# Patient Record
Sex: Male | Born: 1995 | Hispanic: Yes | Marital: Single | Smoking: Never smoker
Health system: Southern US, Community
[De-identification: ages and names within clinical notes are randomized; demographics above are authoritative.]

## PROBLEM LIST (undated history)

## (undated) DIAGNOSIS — F329 Major depressive disorder, single episode, unspecified: Secondary | ICD-10-CM

## (undated) DIAGNOSIS — F32A Depression, unspecified: Secondary | ICD-10-CM

## (undated) DIAGNOSIS — F419 Anxiety disorder, unspecified: Secondary | ICD-10-CM

## (undated) DIAGNOSIS — F410 Panic disorder [episodic paroxysmal anxiety] without agoraphobia: Secondary | ICD-10-CM

## (undated) DIAGNOSIS — H409 Unspecified glaucoma: Secondary | ICD-10-CM

## (undated) HISTORY — PX: FRACTURE SURGERY: SHX138

---
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
## 2019-11-08 ENCOUNTER — Other Ambulatory Visit: Payer: Self-pay

## 2019-11-08 DIAGNOSIS — Z20822 Contact with and (suspected) exposure to covid-19: Secondary | ICD-10-CM

## 2019-11-10 LAB — NOVEL CORONAVIRUS, NAA: SARS-CoV-2, NAA: NOT DETECTED

## 2019-11-11 ENCOUNTER — Telehealth: Payer: Self-pay | Admitting: *Deleted

## 2019-11-11 NOTE — Telephone Encounter (Signed)
Patient called and given negative covid results . 

## 2019-11-13 ENCOUNTER — Encounter (HOSPITAL_COMMUNITY): Payer: Self-pay | Admitting: Emergency Medicine

## 2019-11-13 ENCOUNTER — Emergency Department (HOSPITAL_COMMUNITY)
Admission: EM | Admit: 2019-11-13 | Discharge: 2019-11-13 | Disposition: A | Discharge status: Home / Self Care | Payer: No Typology Code available for payment source | Attending: Emergency Medicine | Admitting: Emergency Medicine

## 2019-11-13 ENCOUNTER — Emergency Department (HOSPITAL_COMMUNITY): Payer: No Typology Code available for payment source

## 2019-11-13 ENCOUNTER — Other Ambulatory Visit: Payer: Self-pay

## 2019-11-13 DIAGNOSIS — Z79899 Other long term (current) drug therapy: Secondary | ICD-10-CM | POA: Diagnosis not present

## 2019-11-13 DIAGNOSIS — W540XXA Bitten by dog, initial encounter: Secondary | ICD-10-CM | POA: Diagnosis not present

## 2019-11-13 DIAGNOSIS — S0181XA Laceration without foreign body of other part of head, initial encounter: Secondary | ICD-10-CM

## 2019-11-13 DIAGNOSIS — Y929 Unspecified place or not applicable: Secondary | ICD-10-CM | POA: Insufficient documentation

## 2019-11-13 DIAGNOSIS — Y999 Unspecified external cause status: Secondary | ICD-10-CM | POA: Insufficient documentation

## 2019-11-13 DIAGNOSIS — Z23 Encounter for immunization: Secondary | ICD-10-CM | POA: Insufficient documentation

## 2019-11-13 DIAGNOSIS — Y939 Activity, unspecified: Secondary | ICD-10-CM | POA: Insufficient documentation

## 2019-11-13 HISTORY — DX: Depression, unspecified: F32.A

## 2019-11-13 HISTORY — DX: Anxiety disorder, unspecified: F41.9

## 2019-11-13 HISTORY — DX: Panic disorder (episodic paroxysmal anxiety): F41.0

## 2019-11-13 MED ORDER — HYDROCODONE-ACETAMINOPHEN 5-325 MG PO TABS: 1.0000 | ORAL_TABLET | Freq: Four times a day (QID) | ORAL | 0 refills | Status: AC | PRN

## 2019-11-13 MED ORDER — CLINDAMYCIN HCL 300 MG PO CAPS: 300.0000 mg | ORAL_CAPSULE | Freq: Three times a day (TID) | ORAL | 0 refills | Status: AC

## 2019-11-13 MED ORDER — TETANUS-DIPHTH-ACELL PERTUSSIS 5-2.5-18.5 LF-MCG/0.5 IM SUSP
0.5000 mL | Freq: Once | INTRAMUSCULAR | Status: AC
  Administered 2019-11-13: 0.5 mL via INTRAMUSCULAR
  Filled 2019-11-13: qty 0.5

## 2019-11-13 MED ORDER — MORPHINE SULFATE (PF) 4 MG/ML IV SOLN
4.0000 mg | Freq: Once | INTRAVENOUS | Status: AC
  Administered 2019-11-13: 4 mg via INTRAVENOUS
  Filled 2019-11-13: qty 1

## 2019-11-13 MED ORDER — IBUPROFEN 600 MG PO TABS: 600.0000 mg | ORAL_TABLET | Freq: Four times a day (QID) | ORAL | 0 refills | Status: AC | PRN

## 2019-11-13 MED ORDER — LIDOCAINE-EPINEPHRINE 1 %-1:100000 IJ SOLN
20.0000 mL | Freq: Once | INTRAMUSCULAR | Status: AC
  Administered 2019-11-13: 20 mL via INTRADERMAL
  Filled 2019-11-13: qty 20

## 2019-11-13 MED ORDER — LORAZEPAM 2 MG/ML IJ SOLN
1.0000 mg | Freq: Once | INTRAMUSCULAR | Status: AC
  Administered 2019-11-13: 1 mg via INTRAVENOUS
  Filled 2019-11-13: qty 1

## 2019-11-13 MED ORDER — LORAZEPAM 2 MG/ML IJ SOLN
INTRAMUSCULAR | Status: AC
  Filled 2019-11-13: qty 1

## 2019-11-13 MED ORDER — SODIUM CHLORIDE 0.9 % IV SOLN
3.0000 g | Freq: Once | INTRAVENOUS | Status: AC
  Administered 2019-11-13: 3 g via INTRAVENOUS
  Filled 2019-11-13: qty 8

## 2019-11-13 NOTE — ED Provider Notes (Signed)
[facility] EMERGENCY DEPARTMENT Provider Note   CSN: [id number] Arrival date & time: 11/13/19  1339     History   Chief Complaint Chief Complaint  Patient presents with   Animal Bite    HPI Juan Vega is a 23 y.o. male.     HPI   23yo male with history of PTSD, anxiety, depression presents with concern for dog bite.  Patient has an adopted pit bull who is UTD on vaccines, reports something triggered the dog and he bit him in the face, arms bilaterally, hands.  Severe facial lacerations. No other injuries. Does report right sided forearm pain at site of bite. Not known if UTD on tetanus.    Past Medical History:  Diagnosis Date   Anxiety    Depression    Panic attacks     There are no active problems to display for this patient.   History reviewed. No pertinent surgical history.      Home Medications    Prior to Admission medications   Medication Sig Start Date End Date Taking? Authorizing Provider  buPROPion (WELLBUTRIN SR) 100 MG 12 hr tablet Take 100 mg by mouth daily.   Yes [provider]  emtricitabine-tenofovir (TRUVADA) 200-300 MG tablet Take 1 tablet by mouth daily.   Yes [provider]  clindamycin (CLEOCIN) 300 MG capsule Take 1 capsule (300 mg total) by mouth 3 (three) times daily for 7 days. 11/13/19 11/20/19  Serena Colonel, MD  HYDROcodone-acetaminophen (NORCO/VICODIN) 5-325 MG tablet Take 1 tablet by mouth every 6 (six) hours as needed. 11/13/19   Alvira Monday, MD  ibuprofen (ADVIL) 600 MG tablet Take 1 tablet (600 mg total) by mouth every 6 (six) hours as needed. 11/13/19   Charlynne Pander, MD    Family History No family history on file.  Social History Social History   Tobacco Use   Smoking status: Never Smoker   Smokeless tobacco: Never Used  Substance Use Topics   Alcohol use: Yes   Drug use: Yes    Types: Marijuana     Allergies   Patient has no known allergies.   Review of  Systems Review of Systems  Constitutional: Negative for fever.  Gastrointestinal: Negative for nausea and vomiting.  Musculoskeletal: Positive for arthralgias.  Skin: Positive for wound.  Neurological: Negative for numbness.     Physical Exam Updated Vital Signs BP 132/90 (BP Location: Left Arm)    Pulse 92    Temp 97.9 F (36.6 C) (Oral)    Resp 20    Wt 65.8 kg    SpO2 94%   Physical Exam Vitals signs and nursing note reviewed.  Constitutional:      General: He is not in acute distress.    Appearance: He is well-developed. He is not diaphoretic.  HENT:     Head: Normocephalic.     Comments: Lacerations (see photo) Between eyebrow, nasal ala, left upper lip with significant defect and missing tissue, superficial punctures to cheek Eyes:     Conjunctiva/sclera: Conjunctivae normal.  Neck:     Musculoskeletal: Normal range of motion.  Cardiovascular:     Rate and Rhythm: Normal rate and regular rhythm.  Pulmonary:     Effort: Pulmonary effort is normal. No respiratory distress.  Abdominal:     Tenderness: There is no guarding.  Musculoskeletal:     Right forearm: He exhibits tenderness and bony tenderness.     Left forearm: He exhibits no bony tenderness.  Right hand: He exhibits no bony tenderness.     Left hand: He exhibits no bony tenderness.     Comments: Small puncture wounds and superficial scratches bilateral forearms, hands  Skin:    General: Skin is warm and dry.  Neurological:     Mental Status: He is alert and oriented to person, place, and time.        ED Treatments / Results  Labs (all labs ordered are listed, but only abnormal results are displayed) Labs Reviewed - No data to display  EKG None  Radiology Dg Forearm Right  Result Date: 11/13/2019 CLINICAL DATA:  Dog bite, bit by his pit bull EXAM: RIGHT FOREARM - 2 VIEW COMPARISON:  None FINDINGS: Osseous mineralization normal. Wrist and elbow joint alignments normal. No acute fracture,  dislocation, or bone destruction. No radiopaque foreign bodies or soft tissue gas. IV at RIGHT antecubital fossa. IMPRESSION: No acute abnormalities. Electronically Signed   By: Ulyses SouthwardMark  Boles M.D.   On: 11/13/2019 15:27    Procedures Procedures (including critical care time)  Medications Ordered in ED Medications  Ampicillin-Sulbactam (UNASYN) 3 g in sodium chloride 0.9 % 100 mL IVPB (0 g Intravenous Stopped 11/13/19 1555)  Tdap (BOOSTRIX) injection 0.5 mL (0.5 mLs Intramuscular Given 11/13/19 1435)  morphine 4 MG/ML injection 4 mg (4 mg Intravenous Given 11/13/19 1435)  lidocaine-EPINEPHrine (XYLOCAINE W/EPI) 1 %-1:100000 (with pres) injection 20 mL (20 mLs Intradermal Given 11/13/19 1708)  LORazepam (ATIVAN) injection 1 mg (1 mg Intravenous Given 11/13/19 1553)     Initial Impression / Assessment and Plan / ED Course  I have reviewed the triage vital signs and the nursing notes.  Pertinent labs & imaging results that were available during my care of the patient were reviewed by me and considered in my medical decision making (see chart for details).        23yo male with history of PTSD, anxiety, depression presents with concern for dog bite. Dog UTD on rabies vaccines.  Pt TDaP updated.  XR without signs of fracture. Doubt other fracture. Foreamr, hand injuries superficial, recommend irrigation, general wound care.   Given unasyn. Consulted Dr. Pollyann Kennedyosen of ENT regarding significant facial lacerations, initial plan for ED irrigation and closure of nasal and laceration between eyebrows with plan for outpatient plastic surgery follow up for reconstructive repair given defect. Discussed with plastic surgery who is not on call and recommend initial management via ENT with later referral after acute injury treatment for possible reconstructive surgery/skin graft or repair given immediate infection risk. Again discussed with Dr. Pollyann Kennedyosen who came to bedside to evaluate the patient who initially was upset,  declining care.  In his upset state, did state that he would consider suicide if he had to live with significant facial deformity and scarring from the injury. He was agitated and initially declined repair by Dr. Pollyann Kennedyosen.  After further discussion, he agreed to repair and was given IV ativan to help with anxiety.  Dr. Pollyann Kennedyosen completed repair per his note of the 3 facial lacerations.  Pt received unasyn in ED, given rx for clindamycin with Dr. Pollyann Kennedyosen and will follow up with him as outpatient.  Recommend ibuprofen/tylenol primarily for pain. Reviewed in [place] drug database. Pt requesting narcotic medications for pain, discussed risks and discouraged use of this but ultimately pt was given 5 tablets of norco for breakthrough pain in addition to the clindamycin rx prescribed by Dr. Pollyann Kennedyosen.  He denies any ongoing suicidal ideation.  Statements made were in  setting of acute injury and associated anxiety regarding appearance and do not feel he requires emergent psychiatric evaluation this evening or inpatient treatment given his denial of continuing SI.   Patient discharged in stable condition with understanding of reasons to return.    Final Clinical Impressions(s) / ED Diagnoses   Final diagnoses:  Facial laceration, initial encounter  Dog bite, initial encounter    ED Discharge Orders         Ordered    clindamycin (CLEOCIN) 300 MG capsule  3 times daily     11/13/19 1624    ibuprofen (ADVIL) 600 MG tablet  Every 6 hours PRN     11/13/19 1702    HYDROcodone-acetaminophen (NORCO/VICODIN) 5-325 MG tablet  Every 6 hours PRN     11/13/19 1711           Gareth Morgan, MD 11/13/19 2317

## 2019-11-13 NOTE — ED Notes (Signed)
Patient transported to X-ray 

## 2019-11-13 NOTE — ED Triage Notes (Signed)
Pt in from home via [facility] with dog bite to face. Pt's own pitbull, up-to-date on vaccines. Pt states dog got triggered and attacked him, biting L lip corner, bilateral wrists and L of nose. Given 100 mcg Fentanyl en route.

## 2019-11-13 NOTE — Discharge Instructions (Addendum)
Apply antibiotic ointment such as neomycin or bacitracin.  Do this 2 or 3 times daily.  Also apply to all of the scrapes or any other small wounds.  Take the antibiotic that was prescribed.  Eat and drink what ever you like but be careful not to stretch the left side of your lip excessively.  You can expect some swelling and some crusting, that is normal.  Is okay to clean any scabs or crusting with peroxide on a Q-tip.  Do that prior to applying the antibiotic ointment.  Take Tylenol 1000 mg 3-4 times a day for 1 week. This is the maximum dose of Tylenol (acetaminophen) you can take from all sources. Please check other over-the-counter medications and prescriptions to ensure you are not taking other medications that contain acetaminophen.  You may also take ibuprofen 400 mg 6 times a day alternating with or at the same time as tylenol.

## 2019-11-13 NOTE — Consult Note (Addendum)
Reason for Consult: Dog bite to face Referring Physician: Gareth Morgan, MD  Juan Vega is an 23 y.o. male.  HPI: Suffered multiple injuries from a pitbull dog bite to the face earlier today.  He is here with his partner who is also a patient and he was waiting in the waiting room to have his finger evaluated from the same encounter.  Patient himself suffers with PTSD and anxiety.  Partner is concerned about his mental state.  Past Medical History:  Diagnosis Date  . Anxiety   . Depression   . Panic attacks     History reviewed. No pertinent surgical history.  No family history on file.  Social History:  reports that he has never smoked. He has never used smokeless tobacco. He reports current alcohol use. He reports current drug use. Drug: Marijuana.  Allergies: No Known Allergies  Medications: Reviewed  No results found for this or any previous visit (from the past 69 hour(s)).  No results found.  SHF:WYOVZCHY except as listed in admit H&P  Blood pressure (!) 145/90, pulse (!) 50, temperature 97.9 F (36.6 C), temperature source Oral, resp. rate 17, weight 65.8 kg, SpO2 98 %.  PHYSICAL EXAM: Overall appearance:  Healthy appearing, very anxious, angry at times, yelling and screaming. Head:  Normocephalic, atraumatic. Face: Multiple complicated lacerations to the face including the left medial eyebrow area, the left nasal ala area, and the left lower lip/cheek area.  Most complex is the left lower lip/cheek injury where there appears to be a significant section of skin missing. Ears: External ears look healthy. Nose: External nose reveals the above-mentioned injury to the ala. Oral Cavity/Pharynx:  There are no mucosal lesions or masses identified.  The oral mucosa appears to be intact. Larynx/Hypopharynx: Deferred Neuro:  No identifiable neurologic deficits. Neck: No palpable neck masses.  Studies Reviewed: none  Procedures: none   Assessment/Plan: Complex  facial lacerations from a recent dog bite injury.  I have recommended simple repair of the eyebrow and nasal ala injury.  I have also recommended attempting repair of what ever could be repairable involving the lip/cheek injury.  Ultimately he is going to require additional reconstructive work possibly with skin graft and possibly with tissue transfer.  The patient is combative and very angry.  He is asking for a second opinion.  There is no other physician available for that.  Plastic surgery was already called earlier and there is nothing they would do differently on an acute setting.  Patient himself is wondering what can be done to prevent any scarring.  He even mentioned at one point that he would be willing to commit suicide based on the injuries.  After multiple attempts to discuss this with the patient, he is still unwilling to allow me to proceed with any repair.  I am going to recommend urgent psychiatric care for him.  Izora Gala 11/13/2019, 3:12 PM    After several conversations with the emergency department staff, patient agreed to undergo repair after receiving intravenous sedation.  He was given intravenous Ativan and settled down nicely.  Procedure note:  Sterile drapes were applied around the facial area.  1% Xylocaine with epinephrine was used infiltrate all 3 wounds to provide local anesthetic.  He tolerated this reasonably well.  The wounds were cleaned with Betadine solution.  All repairs were performed using a combination of running and interrupted 4-0 Vicryl and 4-0 chromic suture.  The lip closure was accomplished by bringing the lip mucosa around to  the front and reapproximating to the skin.  This did result in adequate closure.  There is minimal tension.  Some of the skin edges had questionable viability.  The nasal alar lesion was reapproximated in a similar fashion.  There is also a small area of skin edge necrosis.  The left lateral brow also was closed in a similar  fashion.  There appeared to be some skin that was of questionable viability as well.  Bacitracin was applied to all the lesions.  No other lesions were identified.  Total length of the eyebrow laceration was approximately 2-1/2 cm.  Total length of the nasal ala was approximately 1.5 cm.  Total length of the lip/cheek laceration was approximately 6 cm total.

## 2019-11-13 NOTE — ED Notes (Signed)
I have spoke with Dr. Darl Householder and Dr. Billy Fischer, they only want pt to have tylenol and ibuprofen regimen for pain.

## 2020-12-18 IMAGING — CR DG FOREARM 2V*R*
2 series · 2 of 2 positions shown · non-contrast
Comparison: None

CLINICAL DATA: Dog bite, bit by his pit bull

EXAM:
RIGHT FOREARM - 2 VIEW

[forearm ap]
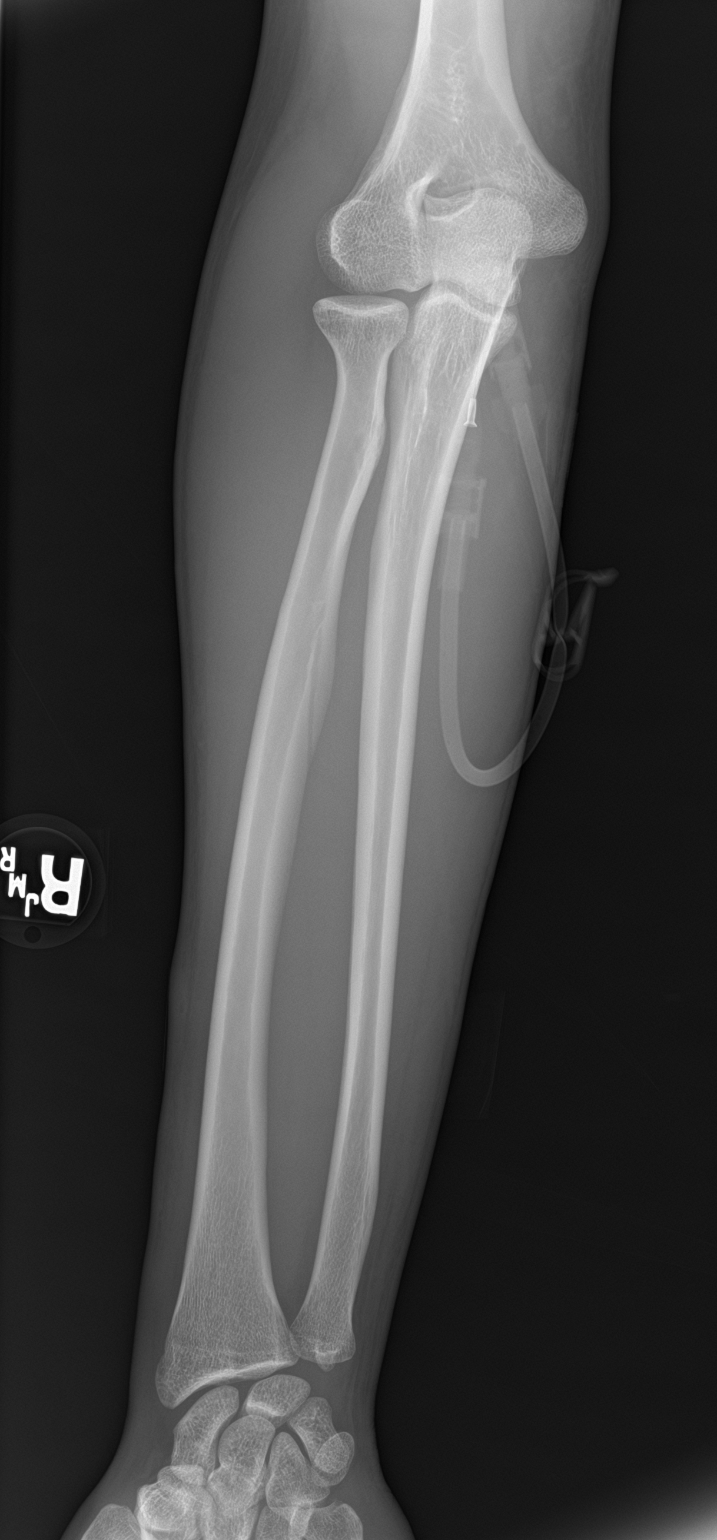

[forearm lat]
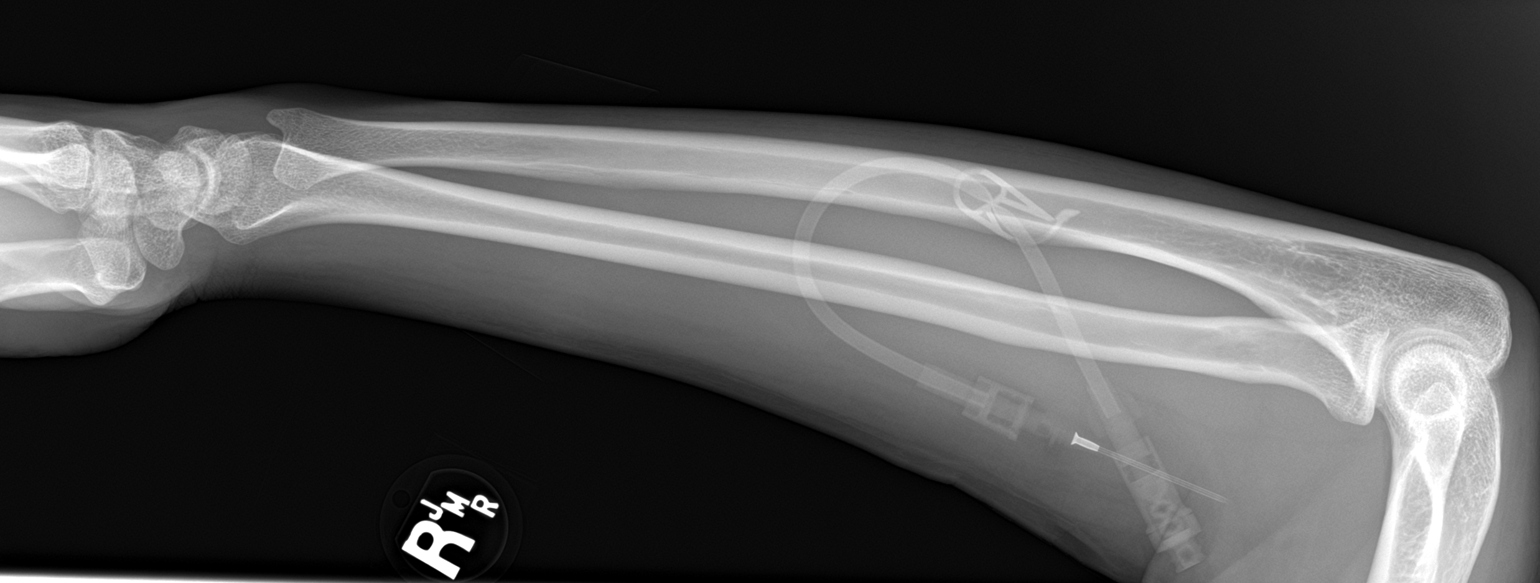

[2 of 2 positions shown; findings below may reference images not displayed]

FINDINGS: Osseous mineralization normal.

Wrist and elbow joint alignments normal.

No acute fracture, dislocation, or bone destruction.

No radiopaque foreign bodies or soft tissue gas.

IV at RIGHT antecubital fossa.
IMPRESSION: No acute abnormalities.

## 2021-08-02 ENCOUNTER — Emergency Department
Admission: EM | Admit: 2021-08-02 | Discharge: 2021-08-02 | Disposition: A | Discharge status: Home / Self Care | Payer: 59 | Attending: Emergency Medicine | Admitting: Emergency Medicine

## 2021-08-02 DIAGNOSIS — T148XXA Other injury of unspecified body region, initial encounter: Secondary | ICD-10-CM

## 2021-08-02 DIAGNOSIS — S0181XA Laceration without foreign body of other part of head, initial encounter: Secondary | ICD-10-CM | POA: Insufficient documentation

## 2021-08-02 DIAGNOSIS — S80211A Abrasion, right knee, initial encounter: Secondary | ICD-10-CM | POA: Insufficient documentation

## 2021-08-02 DIAGNOSIS — S70211A Abrasion, right hip, initial encounter: Secondary | ICD-10-CM | POA: Insufficient documentation

## 2021-08-02 DIAGNOSIS — Y9301 Activity, walking, marching and hiking: Secondary | ICD-10-CM | POA: Insufficient documentation

## 2021-08-02 DIAGNOSIS — F10920 Alcohol use, unspecified with intoxication, uncomplicated: Secondary | ICD-10-CM

## 2021-08-02 DIAGNOSIS — W109XXA Fall (on) (from) unspecified stairs and steps, initial encounter: Secondary | ICD-10-CM | POA: Insufficient documentation

## 2021-08-02 DIAGNOSIS — S60512A Abrasion of left hand, initial encounter: Secondary | ICD-10-CM | POA: Insufficient documentation

## 2021-08-02 DIAGNOSIS — F10120 Alcohol abuse with intoxication, uncomplicated: Secondary | ICD-10-CM | POA: Insufficient documentation

## 2021-08-02 DIAGNOSIS — S0990XA Unspecified injury of head, initial encounter: Secondary | ICD-10-CM | POA: Insufficient documentation

## 2021-08-02 MED ORDER — TETANUS-DIPHTH-ACELL PERTUSSIS 5-2.5-18.5 LF-MCG/0.5 IM SUSP
0.5000 mL | Freq: Once | INTRAMUSCULAR | Status: DC
Start: 2021-08-02 — End: 2021-08-02

## 2021-08-02 MED ORDER — LIDOCAINE HCL 1 % IJ SOLN
10.0000 mL | Freq: Once | INTRAMUSCULAR | Status: AC
Start: 2021-08-02 — End: 2021-08-02
  Administered 2021-08-02: 10 mL via INTRADERMAL
  Filled 2021-08-02: qty 10

## 2021-08-02 MED ORDER — BACITRACIN +/- ZINC 500 UNIT/GM EX OINT (WRAP)
TOPICAL_OINTMENT | Freq: Once | CUTANEOUS | Status: AC
Start: 2021-08-02 — End: 2021-08-02
  Administered 2021-08-02: 1 g via TOPICAL
  Filled 2021-08-02: qty 1

## 2021-08-02 NOTE — ED Triage Notes (Signed)
[name] is a 25 y.o. male with a cc of a head injury s/p fall while walking on steps. Pt reports fall was secondary to ETOH intoxication. Lac to forehead. C/o headache, b/l arm pain, and some abrasions to BLE. Tetanus not UTD. No meds PTA.   Blood pressure 123/77, pulse 97, temperature 97.3 F (36.3 C), resp. rate 16, weight 75.3 kg, SpO2 96 %.

## 2021-08-02 NOTE — ED Notes (Signed)
Pt ambulated well without assistance.

## 2021-08-03 LAB — GLUCOSE WHOLE BLOOD - POCT: Whole Blood Glucose POCT: 109 mg/dL — ABNORMAL HIGH (ref 70–100)

## 2021-08-03 NOTE — ED Provider Notes (Signed)
EMERGENCY DEPARTMENT HISTORY AND PHYSICAL EXAM        Date: 08/02/2021  Patient Name: [name]  Attending Physician: Dr Freda Jackson   Advanced Practice Provider: Shanda Bumps PA-C    History of Presenting Illness       History Provided By: Patient and patient's friend    Chief Complaint:  Chief Complaint   Patient presents with    Fall    Alcohol Intoxication    Head Injury       Additional History: [name] is a 25 y.o. male presenting to the ED accompanied by friend for eval of right forehead lac s/p fall PTA. Patient reports fall was 2/2 ETOH intox. Admits to drinking with friends and states was going down some stairs when he missed a step. Denies loc/syncope, nausea, vomiting, headache, back/neck pain/injury, gait disturbance, abd pain, nv, hematemesis, palpitations, cp, sob.      PCP: Pcp, None, MD  SPECIALISTS:    No current facility-administered medications for this encounter.     Current Outpatient Medications   Medication Sig Dispense Refill    guaiFENesin-codeine (ROBITUSSIN AC) 100-10 MG/5ML syrup Take 5 mLs by mouth 3 (three) times daily as needed for Cough.      hyoscyamine (ANASPAZ,LEVSIN) 0.125 MG tablet Take 0.125 mg by mouth every 4 (four) hours as needed for Cramping.         Past History     Past Medical History:  Past Medical History:   Diagnosis Date    Glaucoma        Past Surgical History:  Past Surgical History:   Procedure Laterality Date    FRACTURE SURGERY      left arm       Family History:  History reviewed. No pertinent family history.    Social History:  Social History     Socioeconomic History    Marital status: Single     Spouse name: None    Number of children: None    Years of education: None    Highest education level: None   Occupational History    None   Tobacco Use    Smoking status: Never    Smokeless tobacco: Never   Vaping Use    Vaping Use: Never used   Substance and Sexual Activity    Alcohol use: Yes    Drug use: No    Sexual activity: None   Other  Topics Concern    None   Social History Narrative    None     Social Determinants of Health     Financial Resource Strain: Not on file   Food Insecurity: Not on file   Transportation Needs: Not on file   Physical Activity: Not on file   Stress: Not on file   Social Connections: Not on file   Intimate Partner Violence: Not on file   Housing Stability: Not on file       Allergies:  No Known Allergies    Review of Systems     Review of Systems   Skin:  Positive for wound.     A pertinent 10 point review of systems was performed and was normal except as otherwise noted. Please also see HPI for added review of systems    Physical Exam     Vitals:    08/02/21 0324 08/02/21 0324   BP:  123/77   Pulse:  97   Resp:  16   Temp:  97.3 F (36.3 C)  SpO2:  96%   Weight: 75.3 kg        Physical Exam    CONSTITUTIONAL: Well-developed, well-nourished, well groomed male in above the knee jean shorts and short sleeve button shirt. Alert, cooperative. Friend at bedside.  HEAD: Normocephalic.  HENT: Atraumatic to external nose and ears, oral mucosa moist. Normal nares without rhinorrhea.  EYES: Conjunctiva clear. No icterus. No discharge or tearing.   NECK: Supple, non tender, trachea midline  RESPIRATORY: Normal work of breathing, no respiratory distress.  CARDIAC: Regular rate. 2+ distal pulses bilaterally  GI: soft, nttp  MS: No bony tenderness. Full ROM grossly intact. No CT  upper and lower extremities Active FROM, no edema, no visible deformity, 5+ MS, NVID distally, 2+ radial pulses bilaterally. Remainder extremities FROM and 5/5 MS  Back: Normal inspection of back, no step offs   Cervical back:  no bony tenderness.       Thoracic back:  no bony tenderness.     Lumbar back:  no bony tenderness.    Hip flexion, adduction, abduction intact   +Full Range of motion of back   5+ muscle strength of upper and lower extremities.   SKIN: Warm and dry.  No ecchymosis.   Right lateral forehead 2cm laceration  Right flank/hip 3cm x 4cm  superficial abrasion  Right knee 2cm x 2cm superficial abrasion  Left hand 2cm x 2cm superficial abrasion  NEURO: AOx3. Normal gait  PSYCH: appropriate mood and affect. alert and oriented to person, place and time    Diagnostic Study Results     Labs -     Results       ** No results found for the last 24 hours. **            Radiologic Studies -   Radiology Results (24 Hour)       ** No results found for the last 24 hours. **        .    Medical Decision Making     I reviewed the vital signs, available nursing notes, past medical history, past surgical history, family history and social history.    Vital Signs-Reviewed the patient's vital signs.    Pulse oximetry analysis - Normal SpO2: SpO2: 96 % on RA    Personal Protective Equipment (PPE)     Face Shield, Gloves, Goggles, N95 and Surgical / Bouffant Cap       Procedures:   Procedures    ------------------- PROCEDURE: LACERATION REPAIR  --------------------     Performed by the emergency provider  Consent:  Informed consent, after discussion of the risks, benefits, and alternatives to the procedure, was obtained  Location:  right lateral forehead  Length: 2 cm  Complexity: Simple  Description: clean wound edges, no foreign bodies  Distal CMS:  Normal.  No deficits.  Neurovascularly intact.  Anesthesia: Lidocaine 1% without epinephrine  Preparation: The wound was cleaned with betadine solution and irrigated with saline/hibiclens.  The area was prepped and draped in the usual sterile fashion.   Exploration:   The wound was explored and no foreign bodies were found.  Procedure: The skin was closed with 5-0 fast absorbing.  There was good approximation.  In total, 3 sutures placed.  Post-Procedure:  Good closure and hemostasis.  The patient tolerated the procedure well and there were no complications.      Old Medical Records: Old medical records, Nursing notes.     ED Course:   ED Course as of  08/04/21 2131   Fri Aug 02, 2021   0400 Declined imaging [SS]      ED  Course User Index  [SS] Margarite Gouge, [place]         Provider Notes:    25 y.o. male presents with head injury s/p witnessed mechanical fall under the influence of ETOH PTA. FS 109, VSS, No focal neuro deficits. Aox3, ambulatory. Patient declined any imaging of head stating he just wants lac repaired. Also declined imaging of extremities stating he just got scratched, not in pain. Lac repaired in ED. Patient observed at length in ED, PO challenged and ambulated about ED prior to discharge home with friend. Discussed with patient he may return if he has any new problems or any worsening problems. Referral to CATS included in [place]. Wound care, head injury precautions, warning s/s reviewed and RTED precautions given.      Diagnosis     Clinical Impression:   1. Alcoholic intoxication without complication    2. Head injuries, initial encounter    3. Forehead laceration, initial encounter    4. Abrasion        Treatment Plan:   ED Disposition       ED Disposition   Discharge    Condition   --    Date/Time   Fri Aug 02, 2021  6:14 AM    Comment   [name] discharge to home/self care.    Condition at disposition: Stable                   _______________________________    CHART OWNERSHIP: I, Shanda Bumps, PA-C, am the primary clinician of record.  _______________________________     Margarite Gouge, PA  08/04/21 2132       Darcus Pester, MD  08/08/21 [phone]
